# Patient Record
Sex: Male | Born: 1990 | Race: White | Hispanic: No | Marital: Married | Smoking: Never smoker
Health system: Southern US, Community
[De-identification: ages and names within clinical notes are randomized; demographics above are authoritative.]

---

## 2015-12-01 ENCOUNTER — Emergency Department (HOSPITAL_BASED_OUTPATIENT_CLINIC_OR_DEPARTMENT_OTHER)
Admission: EM | Admit: 2015-12-01 | Discharge: 2015-12-02 | Disposition: A | Discharge status: Home / Self Care | Payer: 59 | Attending: Emergency Medicine | Admitting: Emergency Medicine

## 2015-12-01 ENCOUNTER — Emergency Department (HOSPITAL_BASED_OUTPATIENT_CLINIC_OR_DEPARTMENT_OTHER): Payer: 59

## 2015-12-01 ENCOUNTER — Encounter (HOSPITAL_BASED_OUTPATIENT_CLINIC_OR_DEPARTMENT_OTHER): Payer: Self-pay

## 2015-12-01 DIAGNOSIS — Z8781 Personal history of (healed) traumatic fracture: Secondary | ICD-10-CM

## 2015-12-01 DIAGNOSIS — S62625A Displaced fracture of medial phalanx of left ring finger, initial encounter for closed fracture: Secondary | ICD-10-CM | POA: Insufficient documentation

## 2015-12-01 DIAGNOSIS — W2209XA Striking against other stationary object, initial encounter: Secondary | ICD-10-CM | POA: Insufficient documentation

## 2015-12-01 DIAGNOSIS — S6992XA Unspecified injury of left wrist, hand and finger(s), initial encounter: Secondary | ICD-10-CM | POA: Diagnosis present

## 2015-12-01 DIAGNOSIS — Y999 Unspecified external cause status: Secondary | ICD-10-CM | POA: Insufficient documentation

## 2015-12-01 DIAGNOSIS — Y9383 Activity, rough housing and horseplay: Secondary | ICD-10-CM | POA: Insufficient documentation

## 2015-12-01 DIAGNOSIS — Y929 Unspecified place or not applicable: Secondary | ICD-10-CM | POA: Insufficient documentation

## 2015-12-01 MED ORDER — LIDOCAINE HCL 2 % IJ SOLN
15.0000 mL | Freq: Once | INTRAMUSCULAR | Status: AC
  Administered 2015-12-01: 300 mg
  Filled 2015-12-01: qty 20

## 2015-12-01 MED ORDER — OXYCODONE-ACETAMINOPHEN 5-325 MG PO TABS
1.0000 | ORAL_TABLET | Freq: Four times a day (QID) | ORAL | Status: DC | PRN
  Administered 2015-12-01: 1 via ORAL
  Filled 2015-12-01: qty 1

## 2015-12-01 NOTE — ED Provider Notes (Signed)
[facility]   CSN: [id number] Arrival date & time: 12/01/15  2127  By signing my name below, I, Mitchell Cruz, attest that this documentation has been prepared under the direction and in the presence of Ricahrd Schwager, MD  Electronically Signed: Clovis PuAvnee Cruz, ED Scribe. 12/01/15. 11:36 PM.  History   Chief Complaint Chief Complaint  Patient presents with  . Finger Injury    The history is provided by the patient. No language interpreter was used.  Hand Pain  This is a new problem. The current episode started 3 to 5 hours ago. The problem occurs constantly. The problem has not changed since onset.Pertinent negatives include no chest pain, no abdominal pain, no headaches and no shortness of breath. Nothing aggravates the symptoms. Nothing relieves the symptoms. Treatments tried: ibuprofen. The treatment provided no relief.   HPI Comments:  Mitchell Cruz is a 25 y.o. male who presents to the Emergency Department complaining of left finger injury s/p an injury which occurred ~8:30 PM tonight was while patient was "horseplaying" with a friend and hit the L ring finger on a brick wall it is been swollen and deformed since that time. Pt states he heard a "pop". No alleviating factors noted. Pt denies any other symptoms or other injuries.  Tetanus UTD.  History reviewed. No pertinent past medical history.  There are no active problems to display for this patient.   History reviewed. No pertinent surgical history.   Home Medications    Prior to Admission medications   Not on File    Family History No family history on file.  Social History Social History  Substance Use Topics  . Smoking status: Never Smoker  . Smokeless tobacco: Current User  . Alcohol use No     Allergies   Amoxil [amoxicillin]   Review of Systems Review of Systems  Respiratory: Negative for shortness of breath.   Cardiovascular: Negative for chest pain.  Gastrointestinal:  Negative for abdominal pain.  Musculoskeletal: Positive for arthralgias. Negative for back pain, gait problem and neck pain.  Skin: Positive for wound.  Neurological: Negative for numbness and headaches.  All other systems reviewed and are negative.    Physical Exam Updated Vital Signs BP 134/78 (BP Location: Right Arm)   Pulse 89   Temp 98.1 F (36.7 C) (Oral)   Resp 18   Ht 5\' 10"  (1.778 m)   Wt 175 lb (79.4 kg)   SpO2 98%   BMI 25.11 kg/m   Physical Exam  Constitutional: He is oriented to person, place, and time. He appears well-developed and well-nourished. No distress.  HENT:  Head: Normocephalic and atraumatic.  Mouth/Throat: Oropharynx is clear and moist. No oropharyngeal exudate.  Moist mucous membranes   Eyes: Conjunctivae are normal. Pupils are equal, round, and reactive to light.  Neck: Normal range of motion. Neck supple. No JVD present. No tracheal deviation present.  Trachea midline No bruit  Cardiovascular: Normal rate, regular rhythm, normal heart sounds and intact distal pulses.   Pulmonary/Chest: Effort normal and breath sounds normal. No stridor. No respiratory distress.  Abdominal: Soft. Bowel sounds are normal. He exhibits no distension. There is no tenderness.  Musculoskeletal:       Left elbow: Normal.       Left wrist: Normal.       Left forearm: Normal.       Left hand: He exhibits deformity. He exhibits no laceration. Normal sensation noted. Normal strength noted.  Hands: Neurological: He is alert and oriented to person, place, and time. He has normal reflexes.  Skin: Skin is warm and dry. Capillary refill takes less than 2 seconds.  abrasion over L ring finger between DIP and nailbed of the dorsal aspect of the finger. No snuff box tenderness L hand neurovascularly intact 3+ radial pulse.  Abrasion over lateral aspect of patella Negative anterior and posterior drawer test Quad tendons intact all compartments LLE is soft  Psychiatric:  He has a normal mood and affect. His behavior is normal.  Nursing note and vitals reviewed.    ED Treatments / Results  DIAGNOSTIC STUDIES:  Oxygen Saturation is 98% on RA, normal by my interpretation.    COORDINATION OF CARE:  11:14 PM Discussed treatment plan with pt at bedside and pt agreed to plan. Pt updated on XR results and plan to reduce finger.   Labs (all labs ordered are listed, but only abnormal results are displayed) Labs Reviewed - No data to display  EKG  EKG Interpretation None       Radiology No results found.  Procedures Reduction of dislocation Date/Time: 12/02/2015 1:54 AM Performed by: Cy Blamer Authorized by: Cy Blamer  Consent: Verbal consent obtained. Consent given by: patient Patient identity confirmed: arm band Preparation: Patient was prepped and draped in the usual sterile fashion. Local anesthesia used: yes Anesthesia: digital block  Anesthesia: Local anesthesia used: yes Local Anesthetic: lidocaine 1% without epinephrine Anesthetic total: 3 mL  Sedation: Patient sedated: no Patient tolerance: Patient tolerated the procedure well with no immediate complications Comments: Lateral displacement improved.  Normal alignment post reduction, NVI but slipped out of place twice.  Cap refill conitnues to be < 2 sec and patient has intact sensation.  Splinted in place   No results found for this or any previous visit. Dg Finger Ring Left  Result Date: 12/02/2015 CLINICAL DATA:  Pain in the left ring finger ; abrasion posteriorly and distally. EXAM: LEFT RING FINGER 2+V COMPARISON:  None. FINDINGS: Acute, closed, comminuted, fracture-dislocation involving base of the left fourth middle phalanx along its volar aspect is noted. There is soft tissue swelling. IMPRESSION: Acute, closed, comminuted volar fracture at the base of the fourth left middle phalanx with dorsal dislocation of the middle phalanx relative to the head of the proximal  phalanx. Electronically Signed   By: Tollie Eth M.D.   On: 12/02/2015 00:10     Medications Ordered in ED Medications  lidocaine (XYLOCAINE) 2 % (with pres) injection 300 mg (not administered)  oxyCODONE-acetaminophen (PERCOCET/ROXICET) 5-325 MG per tablet 1 tablet (not administered)     Initial Impression / Assessment and Plan / ED Course  I have reviewed the triage vital signs and the nursing notes.  Pertinent labs & imaging results that were available during my care of the patient were reviewed by me and considered in my medical decision making (see chart for details).  Clinical Course   Final Clinical Impressions(s) / ED Diagnoses  Reduction of fracture dislocation but slipped out of alignment x 2.  Splinted in place and will have patient follow up with hand surgery.  Call this am to be seen by Hand surgery this week.  Note for work given.  If taking narcotic pain medication you cannot drink alcohol, drive a car or operate machinery.  Patient and mom verbalize understanding and agree to follow up.  All questions answered to patient's satisfaction. Based on history and exam patient has been appropriately medically screened and emergency  conditions excluded. Patient is stable for discharge at this time. Follow up with your PMD for recheck in 2 days and strict return precautions given. RX for percocet  New Prescriptions New Prescriptions   No medications on file  I personally performed the services described in this documentation, which was scribed in my presence. The recorded information has been reviewed and is accurate.      Cy Blamer, MD 12/02/15 [phone]

## 2015-12-01 NOTE — ED Triage Notes (Signed)
Injured left ring finger tonight with trip and fall into a wall-NAD-steady gait

## 2015-12-02 ENCOUNTER — Emergency Department (HOSPITAL_BASED_OUTPATIENT_CLINIC_OR_DEPARTMENT_OTHER): Payer: 59

## 2015-12-02 MED ORDER — OXYCODONE-ACETAMINOPHEN 5-325 MG PO TABS: 1.0000 | ORAL_TABLET | Freq: Four times a day (QID) | ORAL | 0 refills | Status: AC | PRN

## 2015-12-02 NOTE — ED Notes (Signed)
Pt verbalizes understanding of d/c instructions and denies any further needs at this time. 

## 2015-12-02 NOTE — ED Notes (Signed)
MD at bedside. 

## 2017-09-17 IMAGING — DX DG FINGER RING 2+V*L*
2 series · 2 of 2 positions shown · non-contrast
Comparison: 12/02/2015

CLINICAL DATA: Post reduction, fracture

EXAM:
LEFT RING FINGER 2+V

[finger ap]
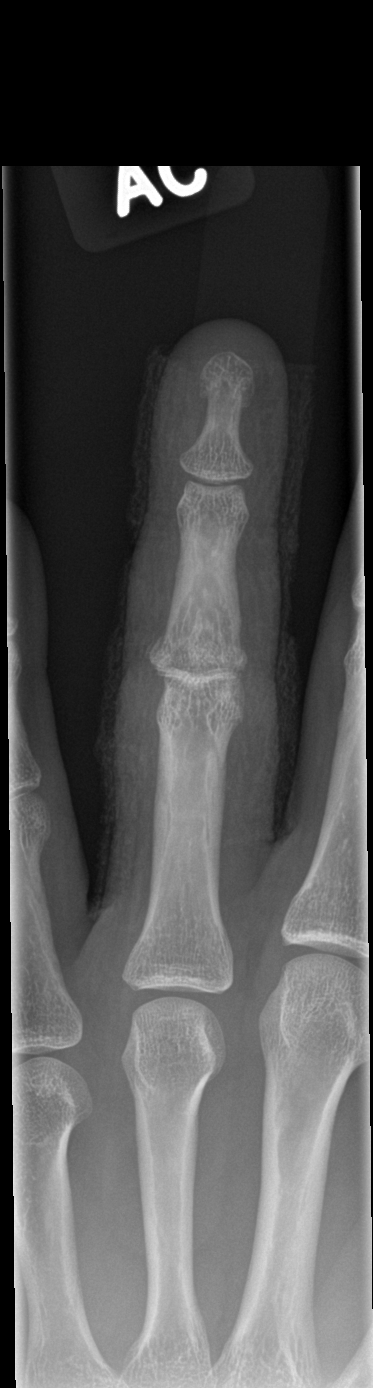

[finger lat]
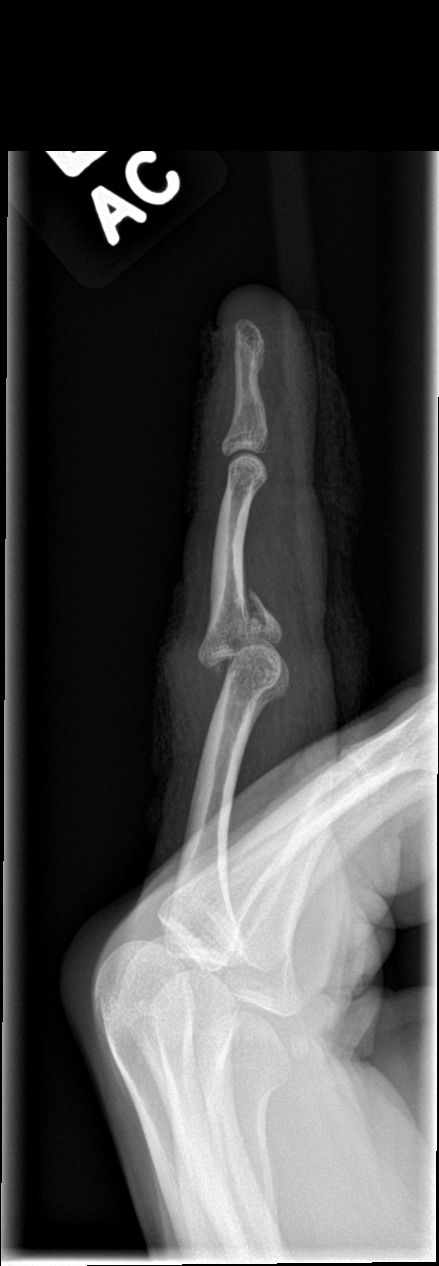

[2 of 2 positions shown; findings below may reference images not displayed]

FINDINGS: Splinting material is in place. Comminuted fracture involving the
volar base of the fourth middle phalanx with articular extension.
Residual [DATE] shaft diameter of dorsal displacement of the base of
the fourth middle phalanx with respect to the head of the fourth
proximal phalanx.
IMPRESSION: Comminuted intra-articular fracture involving the volar base of the
fourth middle phalanx with residual dorsal displacement of the base
of the fourth middle phalanx with respect to the head of the fourth
proximal phalanx.

## 2017-09-17 IMAGING — DX DG FINGER RING 2+V*L*
2 series · 2 of 2 positions shown · non-contrast
Comparison: 12/01/2015

CLINICAL DATA: Post reduction film

EXAM:
LEFT RING FINGER 2+V

[finger ap]
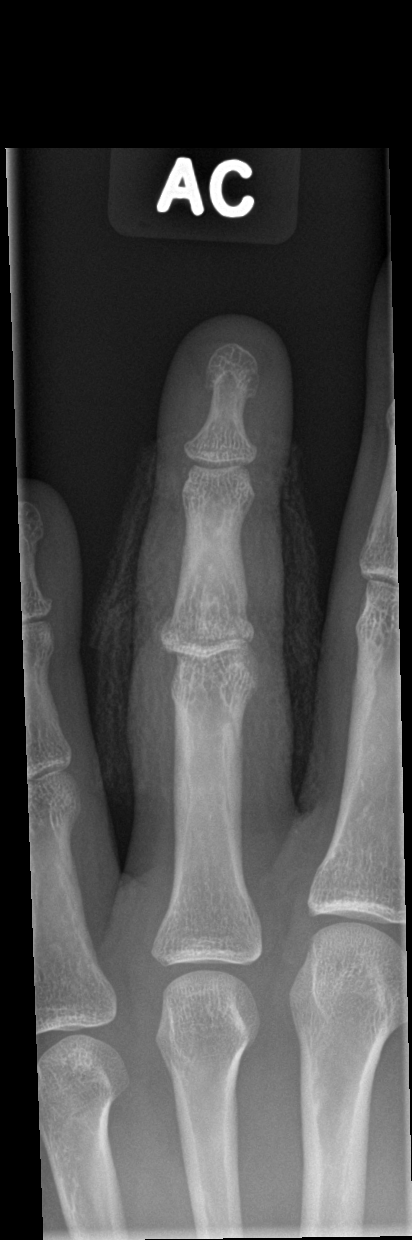

[finger lat]
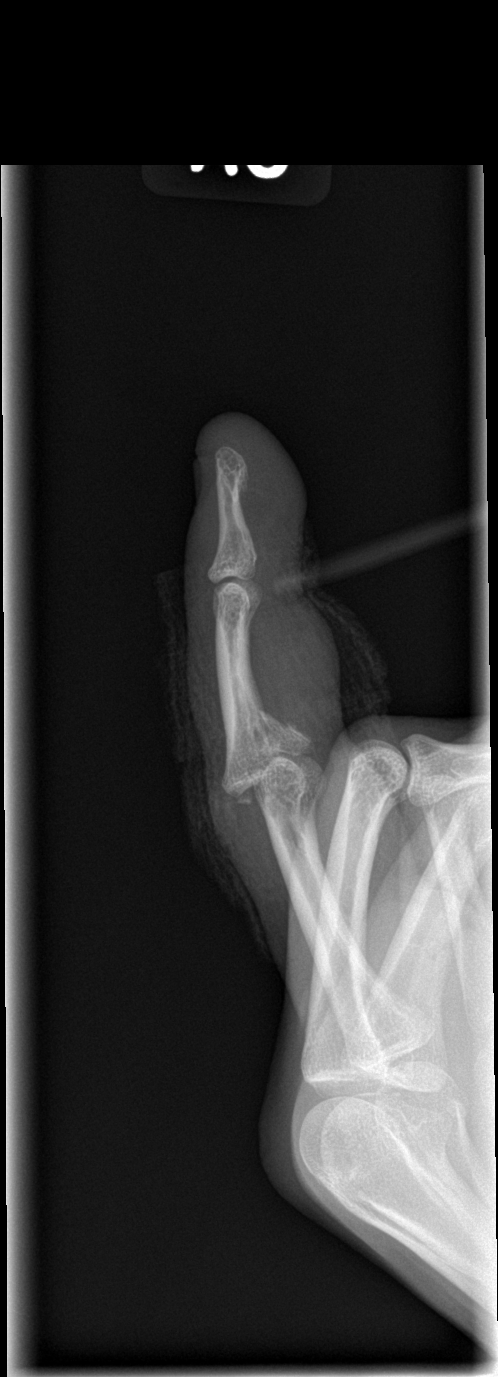

[2 of 2 positions shown; findings below may reference images not displayed]

FINDINGS: Interim placement of splinting material. Again visualized is a
comminuted intra-articular fracture of the volar base of the fourth
middle phalanx. There is residual dorsal subluxation at the fourth
PIP joint. There is closer apposition of the fracture fragments.
IMPRESSION: Interim placement of splinting material. Residual dorsal subluxation
at the fourth PIP joint with evidence of comminuted intra-articular
fracture at the base of the fourth middle phalanx.
